# Patient Record
Sex: Male | Born: 1975 | Race: White | Hispanic: No | Marital: Married | Smoking: Never smoker
Health system: Southern US, Community
[De-identification: ages and names within clinical notes are randomized; demographics above are authoritative.]

---

## 2001-07-15 ENCOUNTER — Encounter: Payer: Self-pay | Admitting: Emergency Medicine

## 2001-07-15 ENCOUNTER — Emergency Department (HOSPITAL_COMMUNITY): Admission: EM | Admit: 2001-07-15 | Discharge: 2001-07-15 | Payer: Self-pay | Admitting: Emergency Medicine

## 2005-08-02 ENCOUNTER — Emergency Department (HOSPITAL_COMMUNITY): Admission: EM | Admit: 2005-08-02 | Discharge: 2005-08-02 | Payer: Self-pay | Admitting: Emergency Medicine

## 2007-07-13 IMAGING — CT CT PELVIS W/O CM
2 of 3 series · 17 of 46 positions shown, 19 images · IV contrast (agent unspecified)
Comparison: None.

CLINICAL DATA: Left flank pain for three days.  History of kidney stones.
 ABDOMEN CT WITHOUT CONTRAST:
TECHNIQUE: Multidetector CT imaging of the abdomen was performed following the standard protocol without IV contrast.
TECHNIQUE: Multidetector CT imaging of the pelvis was performed following the standard protocol without IV contrast.

[Series 2: stone_wo 5.0 b40f st · axial · 0.62mm/px · z∈[-504,-152]mm · 14 of 102 slices shown, 16 images]
[im 7/102  soft-tissue]
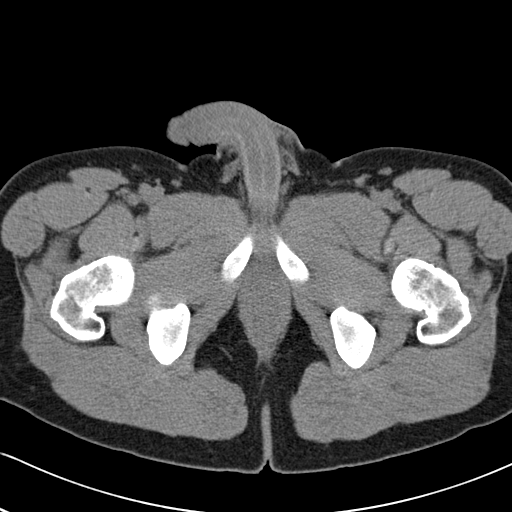
[im 7/102  bone]
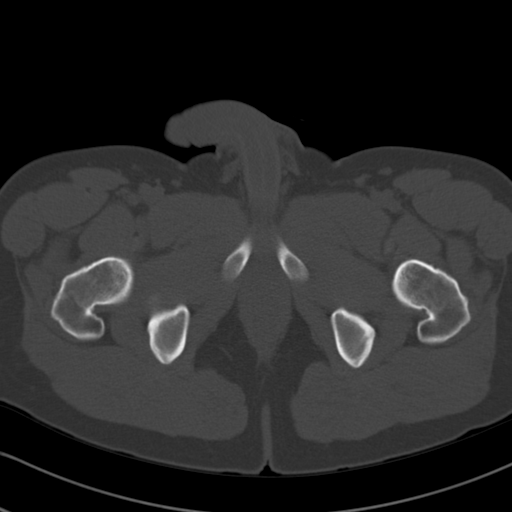
[im 14/102  soft-tissue]
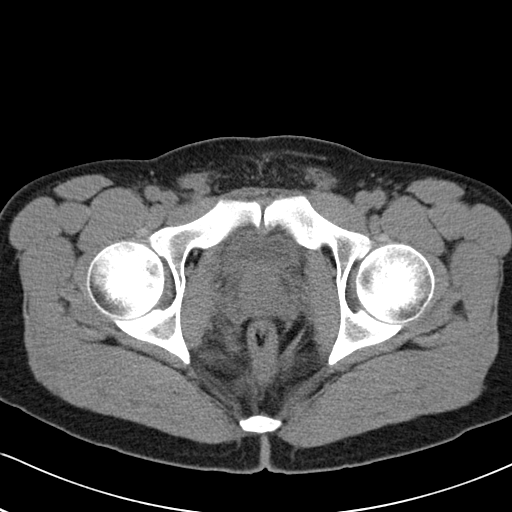
[im 20/102  soft-tissue]
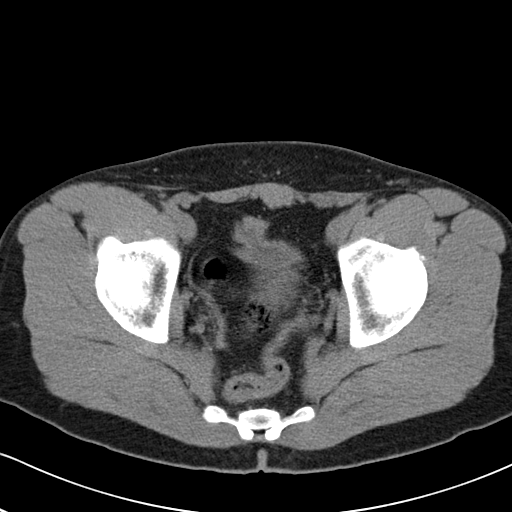
[im 27/102  soft-tissue]
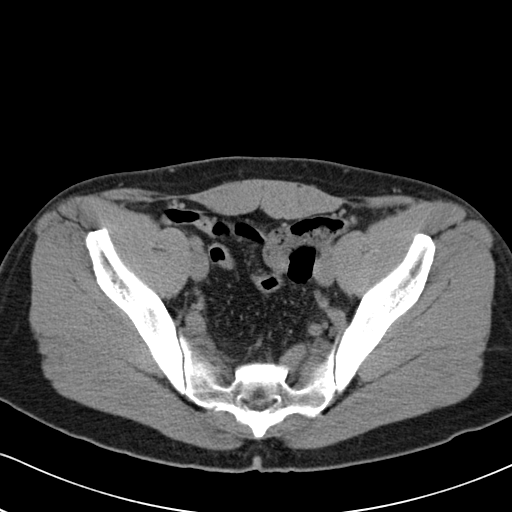
[im 33/102  soft-tissue]
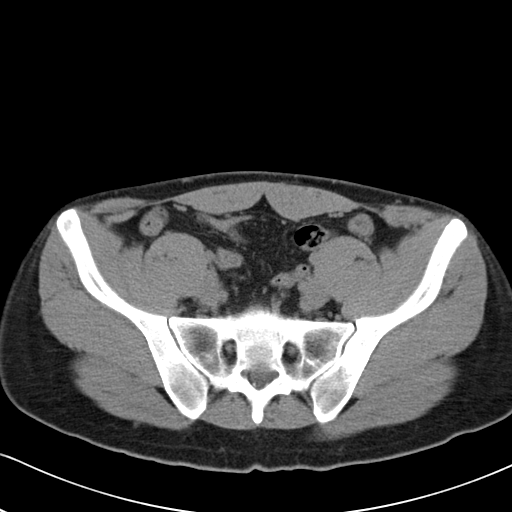
[im 40/102  soft-tissue]
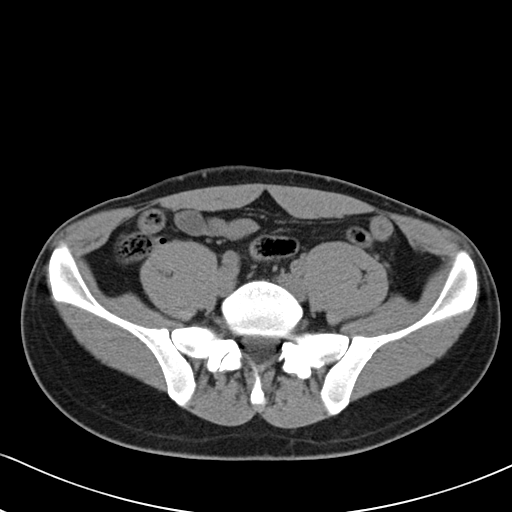
[im 46/102  soft-tissue]
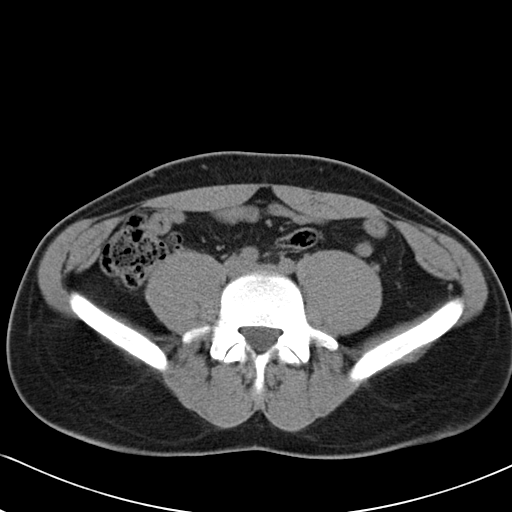
[im 56/102  soft-tissue]
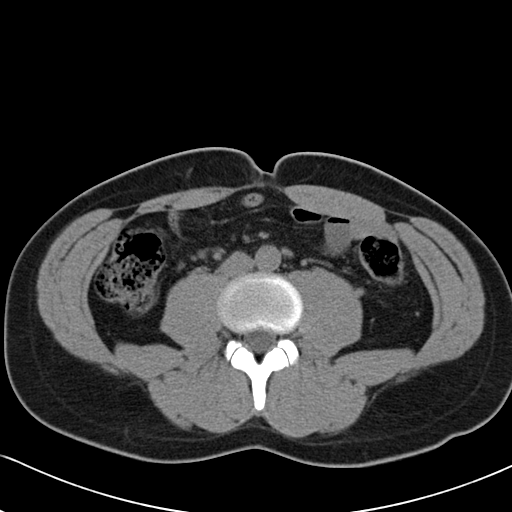
[im 62/102  soft-tissue]
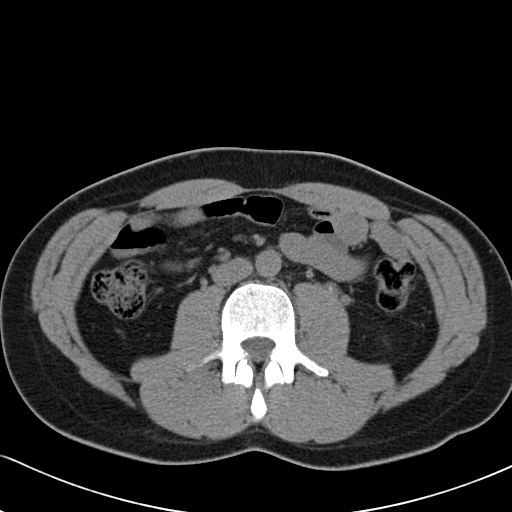
[im 62/102  bone]
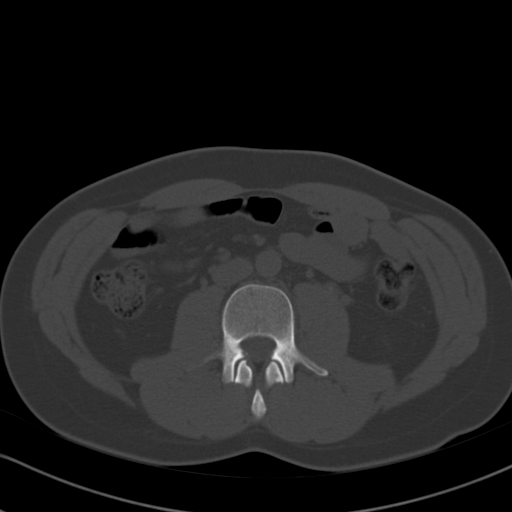
[im 69/102  soft-tissue]
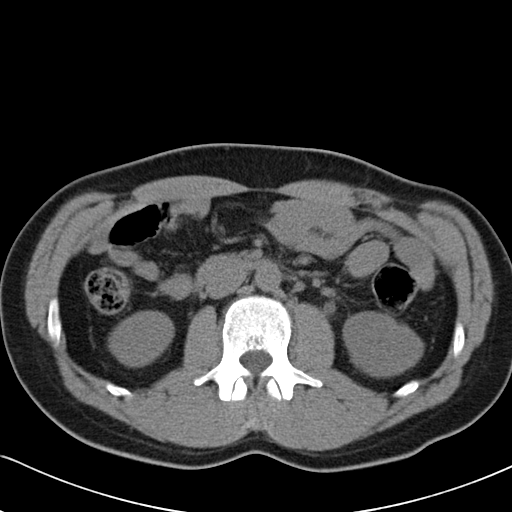
[im 75/102  soft-tissue]
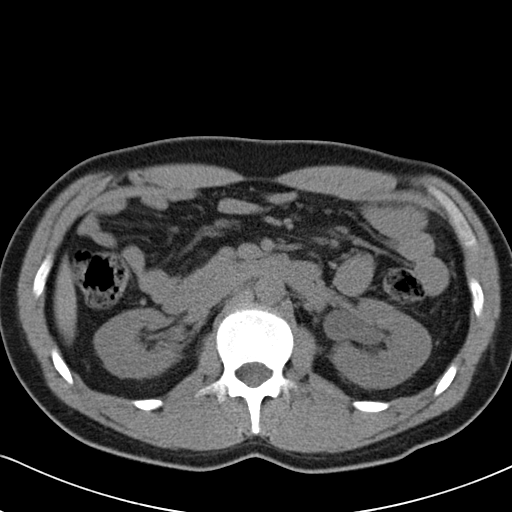
[im 82/102  soft-tissue]
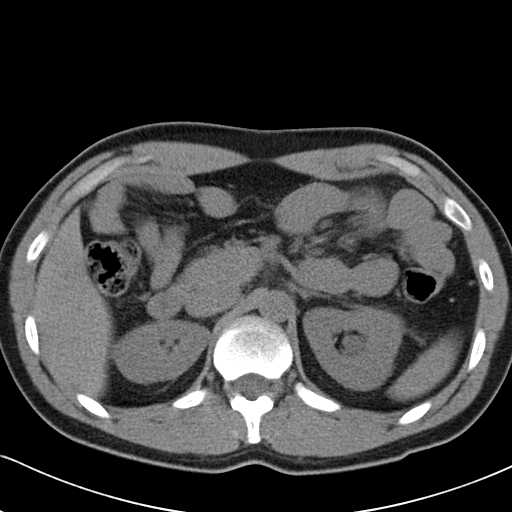
[im 88/102  soft-tissue]
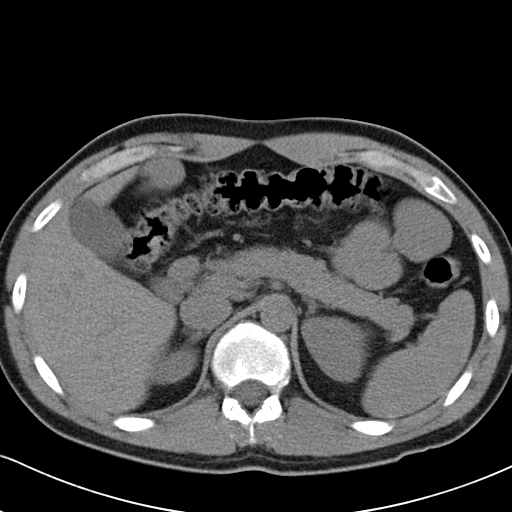
[im 95/102  soft-tissue]
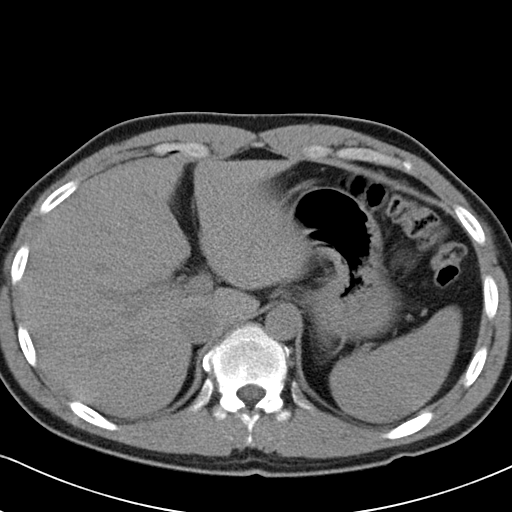

[Series 602: coronal · coronal · 0.83mm/px · 3 of 39 slices shown]
[im 13/39  soft-tissue]
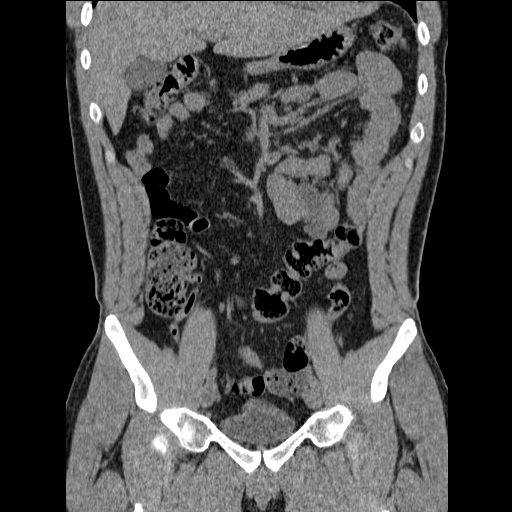
[im 17/39  soft-tissue]
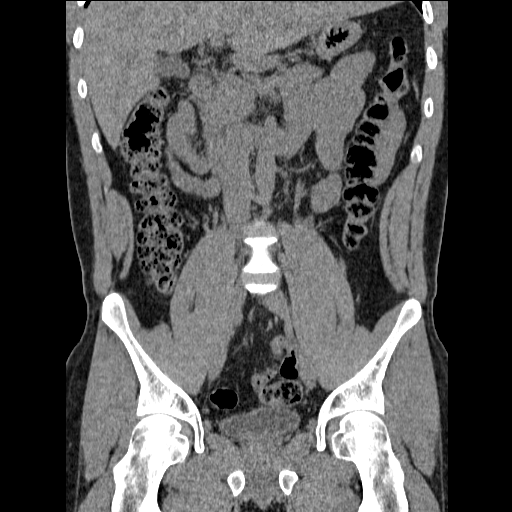
[im 22/39  soft-tissue]
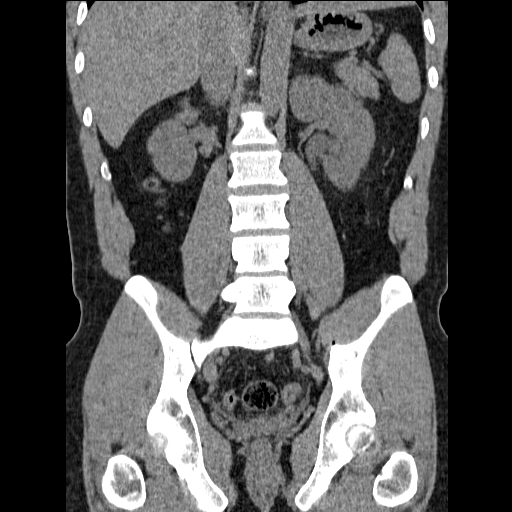

[17 of 46 positions shown; findings below may reference images not displayed]

FINDINGS: Imaged portions of the liver and spleen have normal uninfused appearance.  Pancreas, gallbladder, and adrenal glands are unremarkable.  No free fluid, adenopathy, or abdominal aortic aneurysm.  
 The right kidney has normal features.  There is mild hydronephrosis in the left kidney with a 4 mm calculus in the proximal left ureter.
IMPRESSION: 4 mm stone in the proximal left ureter causes mild left hydronephrosis with secondary changes in the left kidney. 
 PELVIS CT WITHOUT CONTRAST:
FINDINGS: No evidence for intraperitoneal free fluid.  No pelvic lymphadenopathy.  The appendix and terminal ileum have normal features.  Urinary bladder is nondistended.  No evidence for distal ureteral stones.
IMPRESSION: Unremarkable CT examination of the anatomic pelvis.

## 2014-12-23 ENCOUNTER — Other Ambulatory Visit: Payer: Self-pay | Admitting: Occupational Medicine

## 2014-12-23 ENCOUNTER — Ambulatory Visit
Admission: RE | Admit: 2014-12-23 | Discharge: 2014-12-23 | Disposition: A | Discharge status: Home / Self Care | Payer: PRIVATE HEALTH INSURANCE | Source: Ambulatory Visit | Attending: Occupational Medicine | Admitting: Occupational Medicine

## 2014-12-23 DIAGNOSIS — Z0000 Encounter for general adult medical examination without abnormal findings: Secondary | ICD-10-CM

## 2016-12-02 IMAGING — CR DG CHEST 2V
2 series · 2 of 2 positions shown · non-contrast
Comparison: None.

CLINICAL DATA: Exit exam [employer].

EXAM:
CHEST  2 VIEW

[w chest pa]
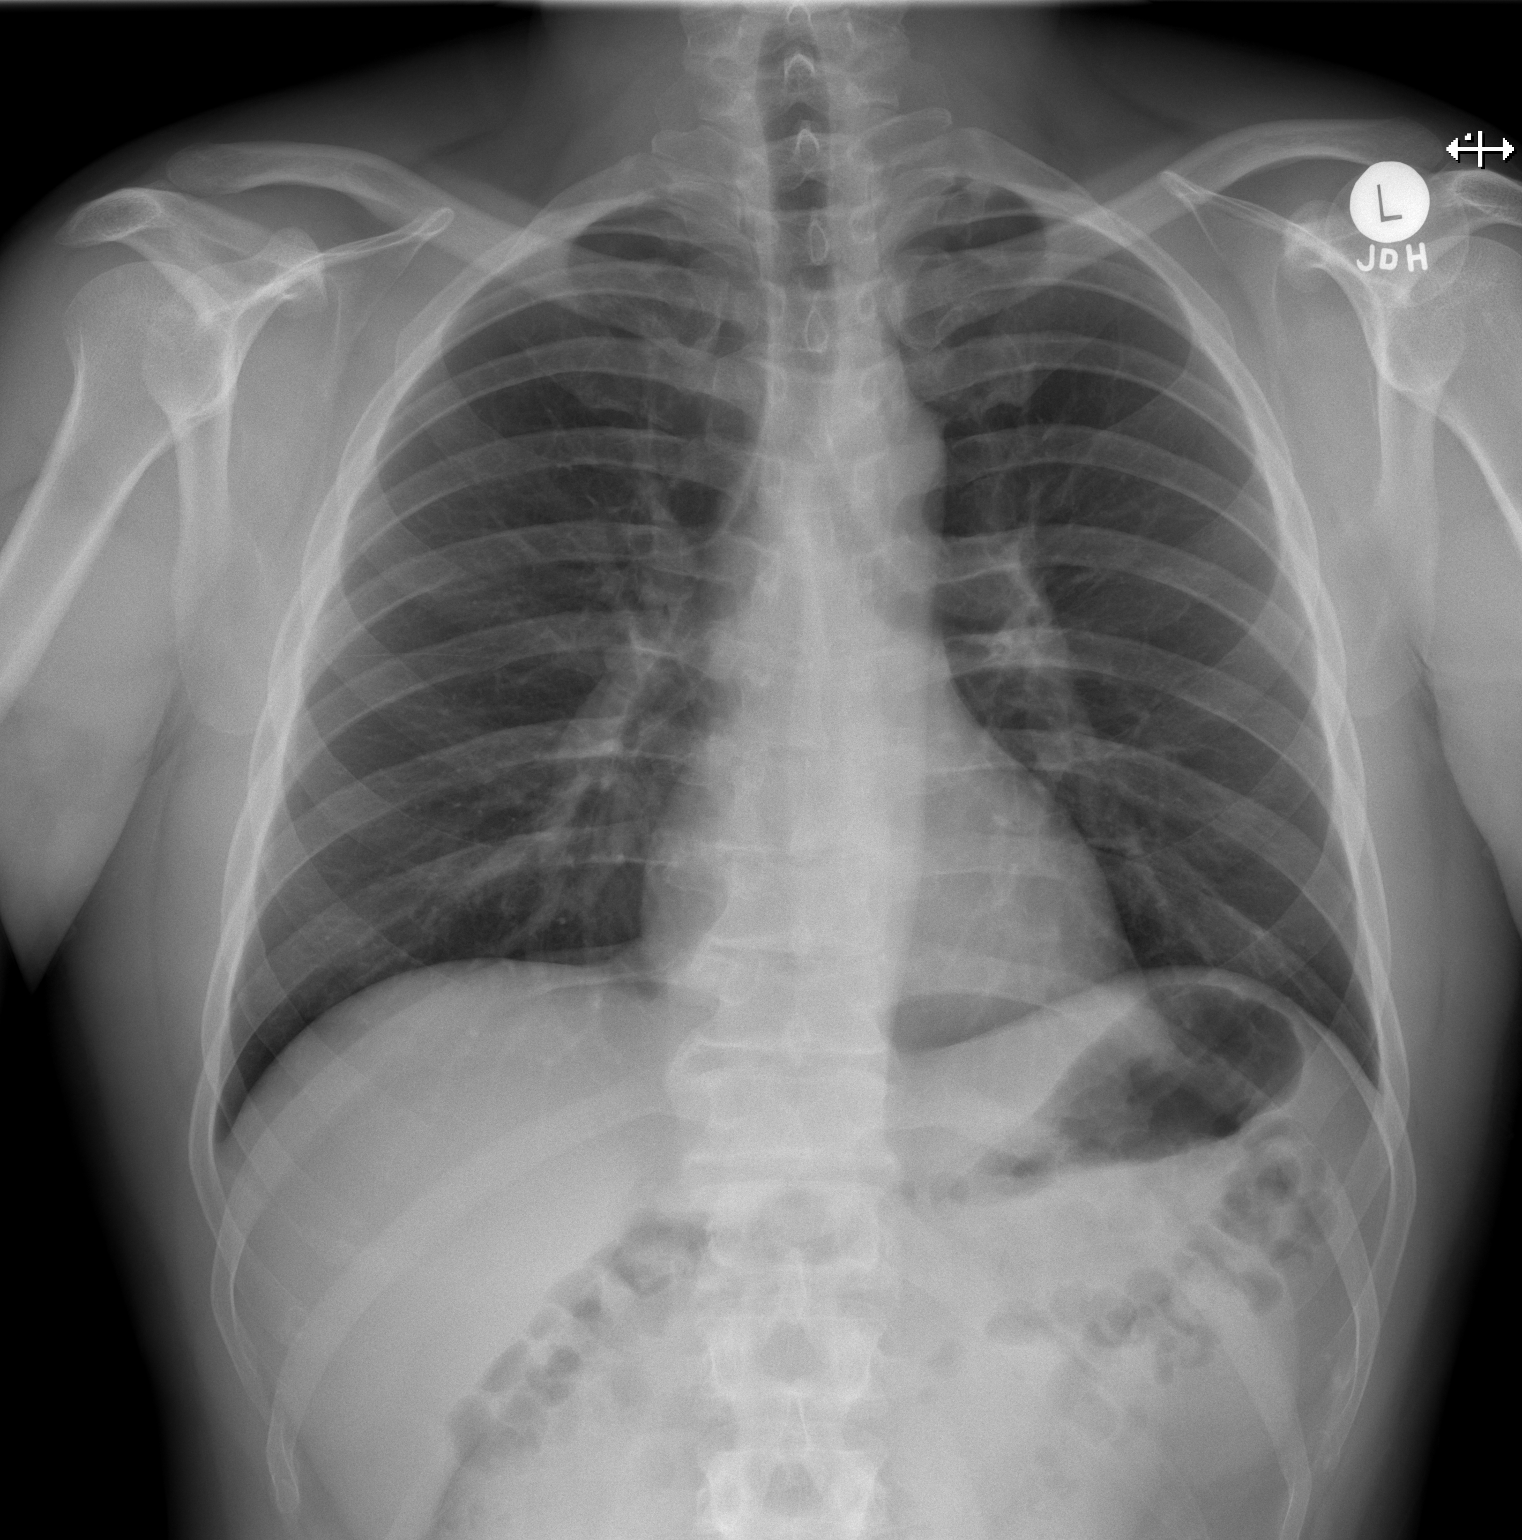

[w chest lat]
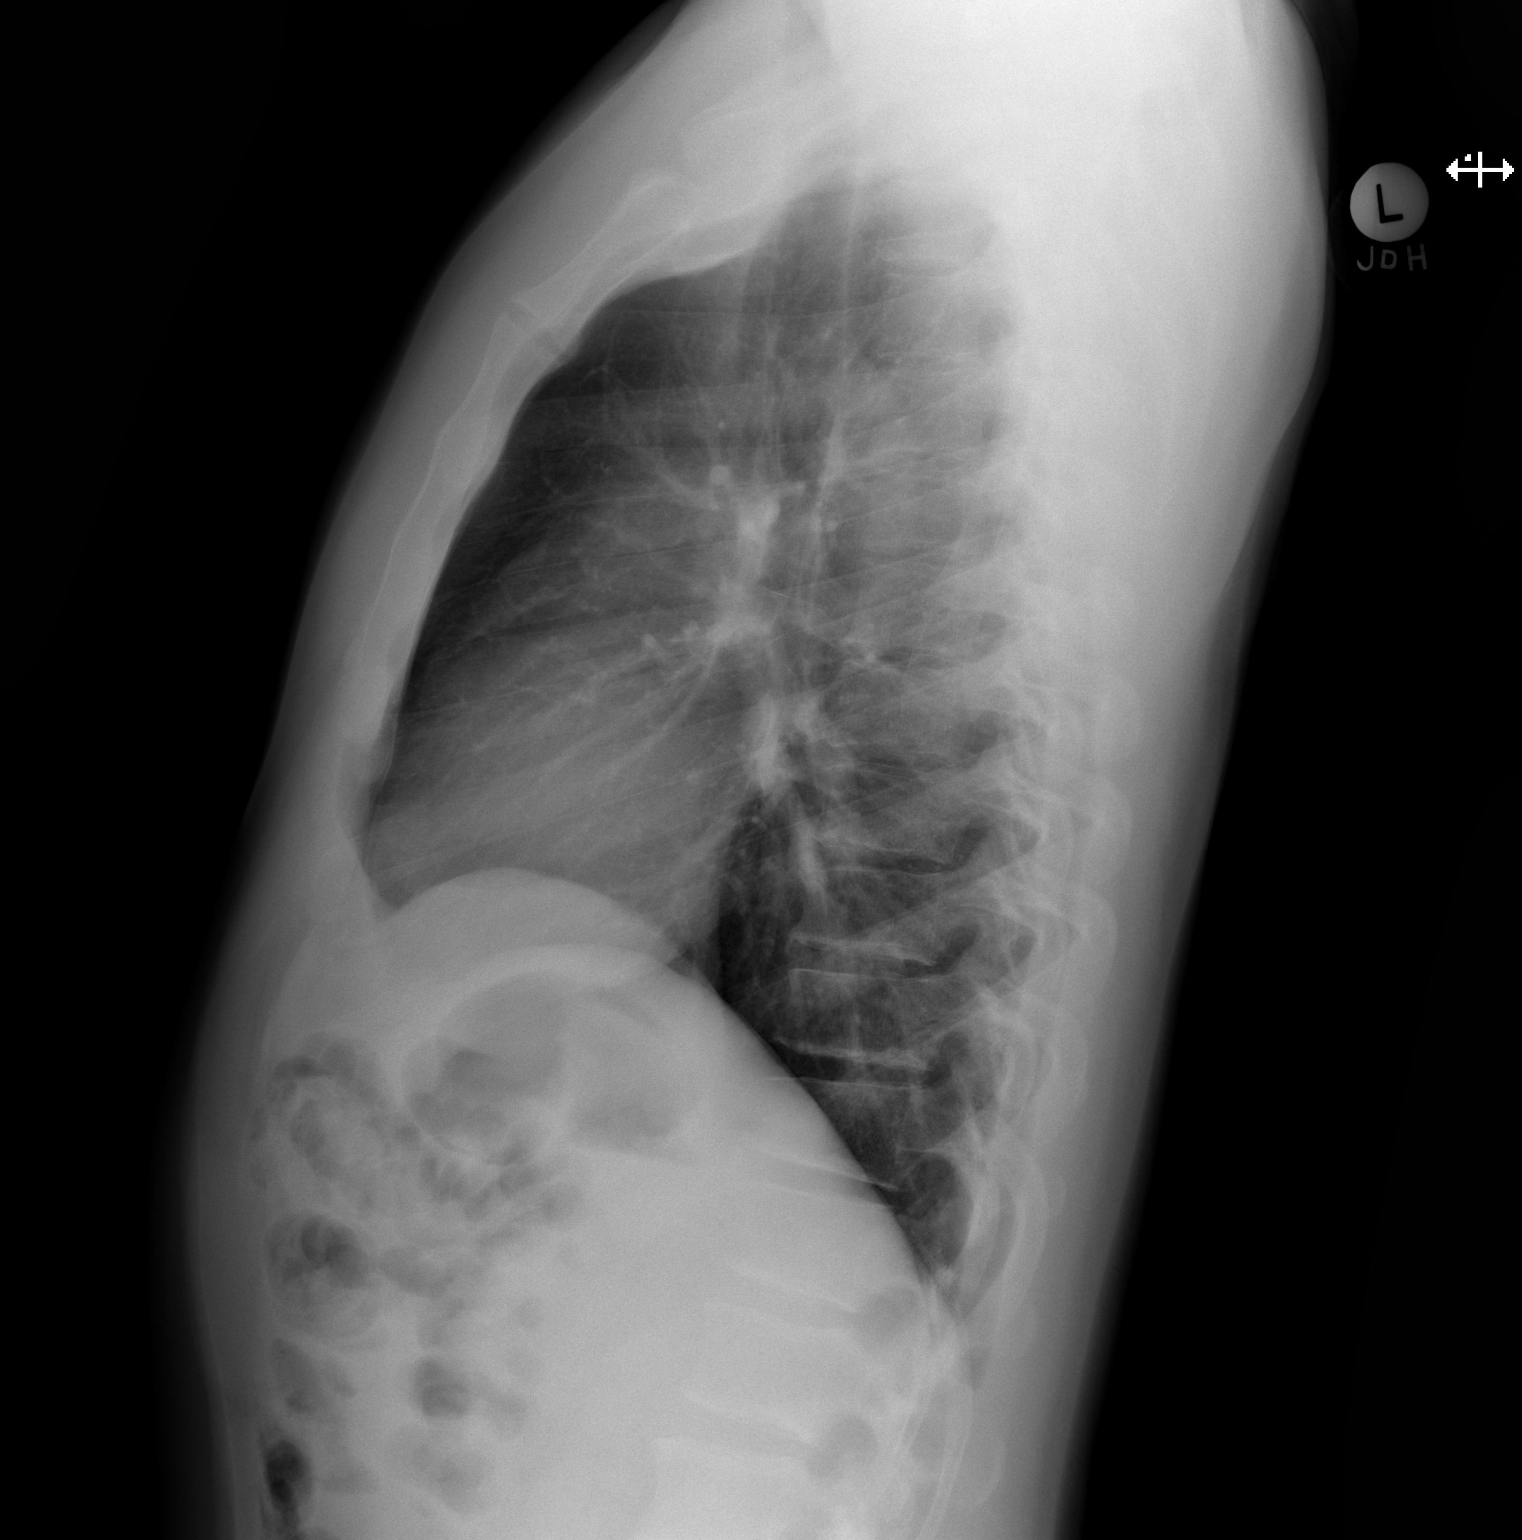

[2 of 2 positions shown; findings below may reference images not displayed]

FINDINGS: The heart size and mediastinal contours are within normal limits.
Both lungs are clear. The visualized skeletal structures are
unremarkable.
IMPRESSION: No active cardiopulmonary disease.

## 2017-10-11 ENCOUNTER — Ambulatory Visit (INDEPENDENT_AMBULATORY_CARE_PROVIDER_SITE_OTHER): Payer: BLUE CROSS/BLUE SHIELD | Admitting: Podiatry

## 2017-10-11 ENCOUNTER — Encounter: Payer: Self-pay | Admitting: Podiatry

## 2017-10-11 VITALS — BP 146/98 | HR 54

## 2017-10-11 DIAGNOSIS — M779 Enthesopathy, unspecified: Secondary | ICD-10-CM

## 2017-10-11 DIAGNOSIS — Q828 Other specified congenital malformations of skin: Secondary | ICD-10-CM | POA: Diagnosis not present

## 2017-10-11 MED ORDER — TRIAMCINOLONE ACETONIDE 10 MG/ML IJ SUSP
10.0000 mg | Freq: Once | INTRAMUSCULAR | Status: AC
  Administered 2017-10-11: 10 mg

## 2017-10-11 NOTE — Progress Notes (Signed)
Subjective:   Patient ID: Craig Pineda, male   DOB: 42 y.o.   MRN: [id number]   HPI Patient presents with severe discomfort underneath the right foot fifth MPJ stating it is been present for several months and he does not remember injury and it is difficult to walk on.  Patient does not smoke and likes to be active   Review of Systems  All other systems reviewed and are negative.       Objective:  Physical Exam  Constitutional: He appears well-developed and well-nourished.  Cardiovascular: Intact distal pulses.  Pulmonary/Chest: Effort normal.  Musculoskeletal: Normal range of motion.  Neurological: He is alert.  Skin: Skin is warm.  Nursing note and vitals reviewed.   Neurovascular status intact muscle strength is adequate range of motion within normal limits with patient found to have inflammation pain with keratotic lesion fifth MPJ right is very painful when pressed and making shoe gear difficult.  Patient is noted to have good digital perfusion and is well oriented x3     Assessment:  Inflammatory capsulitis fifth MPJ right with pain with porokeratotic type lesion     Plan:  H&P conditions reviewed at great length and at this point I injected the fifth MPJ capsule 3 mg Dexasone Kenalog 5 mg Xylocaine debrided the lesion fully and advised on gradual increase in activity and if symptoms persist will be seen back
